# Patient Record
Sex: Male | Born: 1995 | Race: White | Hispanic: No | Marital: Married | Smoking: Current every day smoker
Health system: Southern US, Community
[De-identification: ages and names within clinical notes are randomized; demographics above are authoritative.]

## PROBLEM LIST (undated history)

## (undated) DIAGNOSIS — R519 Headache, unspecified: Secondary | ICD-10-CM

## (undated) DIAGNOSIS — S299XXA Unspecified injury of thorax, initial encounter: Secondary | ICD-10-CM

## (undated) DIAGNOSIS — J45909 Unspecified asthma, uncomplicated: Secondary | ICD-10-CM

## (undated) DIAGNOSIS — T148XXA Other injury of unspecified body region, initial encounter: Secondary | ICD-10-CM

## (undated) DIAGNOSIS — R51 Headache: Secondary | ICD-10-CM

## (undated) DIAGNOSIS — Z87442 Personal history of urinary calculi: Secondary | ICD-10-CM

## (undated) HISTORY — PX: TYMPANOSTOMY TUBE PLACEMENT: SHX32

---
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
## 2016-10-10 DIAGNOSIS — Z87442 Personal history of urinary calculi: Secondary | ICD-10-CM

## 2016-10-10 HISTORY — DX: Personal history of urinary calculi: Z87.442

## 2016-10-12 ENCOUNTER — Other Ambulatory Visit: Payer: Self-pay | Admitting: Family Medicine

## 2016-10-12 DIAGNOSIS — R310 Gross hematuria: Secondary | ICD-10-CM

## 2016-10-17 ENCOUNTER — Ambulatory Visit
Admission: RE | Admit: 2016-10-17 | Discharge: 2016-10-17 | Disposition: A | Discharge status: Home / Self Care | Payer: 59 | Source: Ambulatory Visit | Attending: Family Medicine | Admitting: Family Medicine

## 2016-10-17 DIAGNOSIS — R310 Gross hematuria: Secondary | ICD-10-CM | POA: Insufficient documentation

## 2016-10-17 DIAGNOSIS — N200 Calculus of kidney: Secondary | ICD-10-CM | POA: Diagnosis not present

## 2016-10-31 ENCOUNTER — Encounter: Payer: Self-pay | Admitting: Urology

## 2016-10-31 ENCOUNTER — Ambulatory Visit (INDEPENDENT_AMBULATORY_CARE_PROVIDER_SITE_OTHER): Payer: 59 | Admitting: Urology

## 2016-10-31 ENCOUNTER — Ambulatory Visit
Admission: RE | Admit: 2016-10-31 | Discharge: 2016-10-31 | Disposition: A | Discharge status: Home / Self Care | Payer: 59 | Source: Ambulatory Visit | Attending: Urology | Admitting: Urology

## 2016-10-31 VITALS — BP 107/64 | HR 71 | Ht 65.0 in | Wt 138.8 lb

## 2016-10-31 DIAGNOSIS — N200 Calculus of kidney: Secondary | ICD-10-CM | POA: Diagnosis not present

## 2016-10-31 DIAGNOSIS — R310 Gross hematuria: Secondary | ICD-10-CM

## 2016-10-31 LAB — URINALYSIS, COMPLETE
Bilirubin, UA: NEGATIVE
Glucose, UA: NEGATIVE
Ketones, UA: NEGATIVE
Leukocytes, UA: NEGATIVE
NITRITE UA: NEGATIVE
Protein, UA: NEGATIVE
RBC, UA: NEGATIVE
Specific Gravity, UA: 1.015 (ref 1.005–1.030)
UUROB: 0.2 mg/dL (ref 0.2–1.0)
pH, UA: 7.5 (ref 5.0–7.5)

## 2016-10-31 MED ORDER — IOPAMIDOL (ISOVUE-300) INJECTION 61%: 125.0000 mL | Freq: Once | INTRAVENOUS | Status: DC | PRN

## 2016-10-31 MED ORDER — IOPAMIDOL (ISOVUE-300) INJECTION 61%
150.0000 mL | Freq: Once | INTRAVENOUS | Status: AC | PRN
  Administered 2016-10-31: 150 mL via INTRAVENOUS

## 2016-10-31 NOTE — Progress Notes (Signed)
10/31/2016 1:02 PM   Wayne Cortez March 01, 1995 [id number]  Referring provider: Marina GoodellFeldpausch, Dale E, MD [address] [place], [place] [id number]  Chief Complaint  Patient presents with  . Hematuria    HPI: Wayne Cortez is a 21 year old male seen in consultation at the request of Dr. Maryjane HurterFeldpausch for evaluation of gross hematuria and nephrolithiasis.  He states 3-4 weeks ago he had onset of total gross hematuria which was associated with mild dysuria and left pelvic/back pain.  The hematuria was consistent for 48 hours and has been intermittent since that time.  Last episode that he noticed blood was yesterday.  He has mild urinary frequency.  Denies prior history of hematuria or previous urologic problems.  A stone protocol CT was performed on 10/17/2016 which showed a left pelvic kidney and small, bilateral nonobstructing renal calculi.  He has no voiding complaints.   PMH: History reviewed. No pertinent past medical history.  Surgical History: History reviewed. No pertinent surgical history.  Home Medications:  Allergies as of 10/31/2016   Not on File     Medication List       Accurate as of 10/31/16  1:02 PM. Always use your most recent med list.          cyclobenzaprine 5 MG tablet Commonly known as:  FLEXERIL Take 1 tablet by mouth 3 (three) times daily as needed.   meloxicam 15 MG tablet Commonly known as:  MOBIC Take 1 tablet by mouth daily.       Allergies: Allergies not on file  Family History: History reviewed. No pertinent family history.  Social History:  reports that he has been smoking E-cigarettes.  He uses smokeless tobacco. He reports that he drinks alcohol. He reports that he does not use drugs.  ROS: UROLOGY Frequent Urination?: Yes Hard to postpone urination?: No Burning/pain with urination?: No Get up at night to urinate?: No Leakage of urine?: No Urine stream starts and stops?: No Trouble starting stream?: No Do you have to strain to  urinate?: No Blood in urine?: Yes Urinary tract infection?: No Sexually transmitted disease?: No Injury to kidneys or bladder?: No Painful intercourse?: No Weak stream?: No Erection problems?: No Penile pain?: No  Gastrointestinal Nausea?: Yes Vomiting?: Yes Indigestion/heartburn?: No Diarrhea?: No Constipation?: No  Constitutional Fever: No Night sweats?: No Weight loss?: No Fatigue?: Yes  Skin Skin rash/lesions?: No Itching?: No  Eyes Blurred vision?: No Double vision?: No  Ears/Nose/Throat Sore throat?: No Sinus problems?: No  Hematologic/Lymphatic Swollen glands?: No Easy bruising?: No  Cardiovascular Leg swelling?: No Chest pain?: No  Respiratory Cough?: No Shortness of breath?: No  Endocrine Excessive thirst?: No  Musculoskeletal Back pain?: Yes Joint pain?: No  Neurological Headaches?: No Dizziness?: No  Psychologic Depression?: No Anxiety?: No  Physical Exam: BP 107/64 (BP Location: Right Arm, Patient Position: Sitting, Cuff Size: Normal)   Pulse 71   Ht 5\' 5"  (1.651 m)   Wt 138 lb 12.8 oz (63 kg)   BMI 23.10 kg/m   Constitutional:  Alert and oriented, No acute distress. HEENT: [place] AT, moist mucus membranes.  Trachea midline, no masses. Cardiovascular: No clubbing, cyanosis, or edema. Respiratory: Normal respiratory effort, no increased work of breathing. GI: Abdomen is soft, nontender, nondistended, no abdominal masses GU: No CVA tenderness.  Penis circumcised without lesions, testes descended bilaterally without masses or tenderness, cord/epididymes is palpably normal, no paratesticular abnormalities. Skin: No rashes, bruises or suspicious lesions. Lymph: No cervical or inguinal adenopathy. Neurologic: Grossly intact,  no focal deficits, moving all 4 extremities. Psychiatric: Normal mood and affect.  Laboratory Data: Lab Results  Component Value Date   WBC 9.6 10/19/2012   HGB 16.0 10/19/2012   HCT 47.3 10/19/2012   MCV 90  10/19/2012   PLT 176 10/19/2012    Urinalysis Urinalysis today negative for blood and leukocytes   Pertinent Imaging: CT of 10/17/2016 personally reviewed  Results for orders placed during the hospital encounter of 10/17/16  CT RENAL STONE STUDY   Narrative CLINICAL DATA:  Bilateral lower abdominal and back pain for 2 weeks with gross hematuria.  EXAM: CT ABDOMEN AND PELVIS WITHOUT CONTRAST  TECHNIQUE: Multidetector CT imaging of the abdomen and pelvis was performed following the standard protocol without IV contrast.  COMPARISON:  None.  FINDINGS: Lower chest:  Unremarkable  Hepatobiliary: No focal abnormality in the liver on this study without intravenous contrast. There is no evidence for gallstones, gallbladder wall thickening, or pericholecystic fluid. No intrahepatic or extrahepatic biliary dilation.  Pancreas: No focal mass lesion. No dilatation of the main duct. No intraparenchymal cyst. No peripancreatic edema.  Spleen: No splenomegaly. No focal mass lesion.  Adrenals/Urinary Tract: No adrenal nodule or mass. - several tiny nonobstructing stones are identified in the right kidney measuring up to about 4 mm in the lower pole. 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 No ureteral stones. No bladder stones. No hydroureteronephrosis.  Stomach/Bowel: Stomach is nondistended. No gastric wall thickening. No evidence of outlet obstruction. Duodenum is normally positioned as is the ligament of Treitz. No small bowel wall thickening. No small bowel dilatation. The terminal ileum is normal. The appendix is not visualized, but there is no edema or inflammation in the region of the cecum. No gross colonic mass. No colonic wall thickening. No substantial diverticular change.  Vascular/Lymphatic: No abdominal aortic aneurysm. No abdominal aortic atherosclerotic calcification. There is no gastrohepatic or hepatoduodenal  ligament lymphadenopathy. No intraperitoneal or retroperitoneal lymphadenopathy. No pelvic sidewall lymphadenopathy.  Reproductive: The prostate gland and seminal vesicles have normal imaging features.  Other: No intraperitoneal free fluid.  Musculoskeletal: Bone windows reveal no worrisome lytic or sclerotic osseous lesions.  IMPRESSION: 1. Tiny bilateral nonobstructing renal stones. 2. Left pelvic kidney.   Electronically Signed   By: Kennith Center M.D.   On: 10/17/2016 15:55     Assessment & Plan:    1. Gross hematuria He and his mother were informed that the small, nonobstructing renal calculi would be an unlikely source of his gross hematuria.  Have recommended proceeding with a CT urogram.  If no definite abnormalities on CT urogram will proceed with cystoscopy under anesthesia  - Urinalysis, Complete - CT HEMATURIA WORKUP; Future    Riki Altes, MD  [facility] [address], [address] [place], [place] [address] [phone]

## 2016-11-02 ENCOUNTER — Ambulatory Visit: Payer: 59 | Admitting: Urology

## 2016-11-02 ENCOUNTER — Ambulatory Visit (INDEPENDENT_AMBULATORY_CARE_PROVIDER_SITE_OTHER): Payer: 59 | Admitting: Urology

## 2016-11-02 VITALS — BP 122/68 | HR 62 | Ht 65.0 in | Wt 132.5 lb

## 2016-11-02 DIAGNOSIS — R310 Gross hematuria: Secondary | ICD-10-CM

## 2016-11-02 NOTE — Progress Notes (Signed)
11/02/2016 4:39 PM   Wayne Cortez 03-02-1995 [id number]  Referring provider: Marina Goodell, MD [address] [place], [place] [address]  Chief Complaint  Patient presents with  . Follow-up    HPI: Seen on 10/31/2016 for gross hematuria.  A noncontrast CT of the abdomen pelvis remarkable for punctate, nonobstructing renal calculi bilaterally.  A CT urogram was recommended which was performed yesterday.  He has a left pelvic kidney.  Other than the punctate bilateral renal calculi no other abnormalities are noted.  PMH: No past medical history on file.  Surgical History: No past surgical history on file.  Home Medications:  Allergies as of 11/02/2016   Not on File     Medication List       Accurate as of 11/02/16  4:39 PM. Always use your most recent med list.          cyclobenzaprine 5 MG tablet Commonly known as:  FLEXERIL Take 1 tablet by mouth 3 (three) times daily as needed.   meloxicam 15 MG tablet Commonly known as:  MOBIC Take 1 tablet by mouth daily.       Allergies: Allergies not on file  Family History: No family history on file.  Social History:  reports that he has been smoking E-cigarettes.  He uses smokeless tobacco. He reports that he drinks alcohol. He reports that he does not use drugs.  ROS: UROLOGY Frequent Urination?: No Hard to postpone urination?: No Burning/pain with urination?: No Get up at night to urinate?: No Leakage of urine?: No Urine stream starts and stops?: No Trouble starting stream?: No Do you have to strain to urinate?: No Blood in urine?: No Urinary tract infection?: No Sexually transmitted disease?: No Injury to kidneys or bladder?: No Painful intercourse?: No Weak stream?: No Erection problems?: No Penile pain?: No  Gastrointestinal Nausea?: No Vomiting?: No Indigestion/heartburn?: No Diarrhea?: No Constipation?: No  Constitutional Fever: No Night sweats?: No Weight loss?: No Fatigue?:  No  Skin Skin rash/lesions?: No Itching?: No  Eyes Blurred vision?: No Double vision?: No  Ears/Nose/Throat Sore throat?: No Sinus problems?: No  Hematologic/Lymphatic Swollen glands?: No Easy bruising?: No  Cardiovascular Leg swelling?: No Chest pain?: No  Respiratory Cough?: No Shortness of breath?: No  Endocrine Excessive thirst?: No  Musculoskeletal Back pain?: Yes Joint pain?: No  Neurological Headaches?: Yes Dizziness?: No  Psychologic Depression?: No Anxiety?: No  Physical Exam: BP 122/68   Pulse 62   Ht 5\' 5"  (1.651 m)   Wt 132 lb 8 oz (60.1 kg)   BMI 22.05 kg/m   Constitutional:  Alert and oriented, No acute distress. HEENT: [place] AT, moist mucus membranes.  Trachea midline, no masses. Cardiovascular: No clubbing, cyanosis, or edema. RRR Respiratory: Normal respiratory effort, no increased work of breathing.  Clear to auscultation GI: Abdomen is soft, nontender, nondistended, no abdominal masses GU: No CVA tenderness.  Skin: No rashes, bruises or suspicious lesions. Lymph: No cervical or inguinal adenopathy. Neurologic: Grossly intact, no focal deficits, moving all 4 extremities. Psychiatric: Normal mood and affect.  Laboratory Data: Lab Results  Component Value Date   WBC 9.6 10/19/2012   HGB 16.0 10/19/2012   HCT 47.3 10/19/2012   MCV 90 10/19/2012   PLT 176 10/19/2012   Urinalysis Lab Results  Component Value Date   SPECGRAV 1.015 10/31/2016   PHUR 7.5 10/31/2016   COLORU Yellow 10/31/2016   APPEARANCEUR Clear 10/31/2016   LEUKOCYTESUR Negative 10/31/2016   PROTEINUR Negative 10/31/2016  GLUCOSEU Negative 10/31/2016   KETONESU Negative 10/31/2016   RBCU Negative 10/31/2016   BILIRUBINUR Negative 10/31/2016   UUROB 0.2 10/31/2016   NITRITE Negative 10/31/2016    No results found for: LABMICR, WBCUA, RBCUA, LABEPIT, MUCUS, BACTERIA  Pertinent Imaging:  CT urogram personally reviewed  Results for orders placed during the  hospital encounter of 10/31/16  CT HEMATURIA WORKUP   Narrative CLINICAL DATA:  21 year old male with history of kidney stones. Recent history of hematuria.  EXAM: CT ABDOMEN AND PELVIS WITHOUT AND WITH CONTRAST  TECHNIQUE: Multidetector CT imaging of the abdomen and pelvis was performed following the standard protocol before and following the bolus administration of intravenous contrast.  CONTRAST:  150mL ISOVUE-300 IOPAMIDOL (ISOVUE-300) INJECTION 61%  COMPARISON:  CT the abdomen and pelvis 10/17/2016.  FINDINGS: Lower chest: Unremarkable.  Hepatobiliary: No suspicious cystic or solid hepatic lesions. No intra or extrahepatic biliary ductal dilatation. Gallbladder is normal in appearance.  Pancreas: No pancreatic mass. No pancreatic ductal dilatation. No pancreatic or peripancreatic fluid or inflammatory changes.  Spleen: Unremarkable.  Adrenals/Urinary Tract: Noncontrast images demonstrate some very faint 1-2 mm calcifications in the right renal collecting system, which likely represent tiny developing nonobstructive calculi. Possible 1 mm nonobstructive calculus in the left renal collecting system as well. No definite calculi are noted along the course of either ureter or within the lumen of the urinary bladder. There is no hydroureteronephrosis to suggest urinary tract obstruction at this time. Right kidney is normal in appearance. A left-sided pelvic kidney. Delayed post-contrast images demonstrate no definite filling defect within the collecting system of either kidney, along the course of either ureter, or within the lumen of the urinary bladder to strongly suggest the presence of a urothelial neoplasm at this time. Bilateral adrenal glands are normal in appearance.  Stomach/Bowel: Normal appearance of the stomach. No pathologic dilatation of small bowel or colon. The appendix is not confidently identified and may be surgically absent. Regardless, there are  no inflammatory changes noted adjacent to the cecum to suggest the presence of an acute appendicitis at this time.  Vascular/Lymphatic: No significant atherosclerotic disease or aneurysm identified in the visualized abdominal vasculature. No lymphadenopathy noted in the abdomen or pelvis.  Reproductive: Prostate gland and seminal vesicles are unremarkable in appearance.  Other: No significant volume of ascites.  No pneumoperitoneum.  Musculoskeletal: There are no aggressive appearing lytic or blastic lesions noted in the visualized portions of the skeleton.  IMPRESSION: 1. Tiny nonobstructive calculi in the collecting systems of both kidneys. No ureteral stones or findings of urinary tract obstruction are noted at this time. 2. 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   Electronically Signed   By: Trudie Reedaniel  Entrikin M.D.   On: 10/31/2016 15:13    Results for orders placed during the hospital encounter of 10/17/16  CT RENAL STONE STUDY   Narrative CLINICAL DATA:  Bilateral lower abdominal and back pain for 2 weeks with gross hematuria.  EXAM: CT ABDOMEN AND PELVIS WITHOUT CONTRAST  TECHNIQUE: Multidetector CT imaging of the abdomen and pelvis was performed following the standard protocol without IV contrast.  COMPARISON:  None.  FINDINGS: Lower chest:  Unremarkable  Hepatobiliary: No focal abnormality in the liver on this study without intravenous contrast. There is no evidence for gallstones, gallbladder wall thickening, or pericholecystic fluid. No intrahepatic or extrahepatic biliary dilation.  Pancreas: No focal mass lesion. No dilatation of the main duct. No intraparenchymal cyst. No peripancreatic edema.  Spleen: No splenomegaly. No focal  mass lesion.  Adrenals/Urinary Tract: No adrenal nodule or mass. - several tiny nonobstructing stones are identified in the right kidney measuring up to about 4 mm in the lower pole.  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 No ureteral stones. No bladder stones. No hydroureteronephrosis.  Stomach/Bowel: Stomach is nondistended. No gastric wall thickening. No evidence of outlet obstruction. Duodenum is normally positioned as is the ligament of Treitz. No small bowel wall thickening. No small bowel dilatation. The terminal ileum is normal. The appendix is not visualized, but there is no edema or inflammation in the region of the cecum. No gross colonic mass. No colonic wall thickening. No substantial diverticular change.  Vascular/Lymphatic: No abdominal aortic aneurysm. No abdominal aortic atherosclerotic calcification. There is no gastrohepatic or hepatoduodenal ligament lymphadenopathy. No intraperitoneal or retroperitoneal lymphadenopathy. No pelvic sidewall lymphadenopathy.  Reproductive: The prostate gland and seminal vesicles have normal imaging features.  Other: No intraperitoneal free fluid.  Musculoskeletal: Bone windows reveal no worrisome lytic or sclerotic osseous lesions.  IMPRESSION: 1. Tiny bilateral nonobstructing renal stones. 2. Left pelvic kidney.   Electronically Signed   By: Kennith Center M.D.   On: 10/17/2016 15:55     Assessment & Plan:   1.  Gross hematuria Discussed with patient and his mother that the small renal calculi would be an unlikely cause of his gross hematuria.  Recommended cystoscopy for lower tract evaluation.  He requested this be performed under anesthesia/sedation.  The procedure was discussed in detail including potential risks of bleeding and infection.  They are agreeable to bladder biopsy if any abnormalities are noted intraoperatively.  - Urinalysis, Complete   Riki Altes, MD   [facility] [address], [address] [place], [place] [address] [phone]

## 2016-11-03 LAB — URINALYSIS, COMPLETE
Bilirubin, UA: NEGATIVE
Glucose, UA: NEGATIVE
Ketones, UA: NEGATIVE
LEUKOCYTES UA: NEGATIVE
NITRITE UA: NEGATIVE
PH UA: 7.5 (ref 5.0–7.5)
PROTEIN UA: NEGATIVE
RBC, UA: NEGATIVE
SPEC GRAV UA: 1.02 (ref 1.005–1.030)
Urobilinogen, Ur: 0.2 mg/dL (ref 0.2–1.0)

## 2016-11-04 ENCOUNTER — Telehealth: Payer: Self-pay

## 2016-11-04 ENCOUNTER — Ambulatory Visit: Payer: 59 | Admitting: Urology

## 2016-11-04 NOTE — Telephone Encounter (Signed)
Patient notified of surgery date and pre admit phone call date. The patient is scheduled for surgery with Dr Lonna CobbStoioff at [facility] on 11/15/16. He will pre admit by phone on 11/11/16. A surgery packet has been mailed to the patient. The patient is aware of dates and instructions.

## 2016-11-07 ENCOUNTER — Ambulatory Visit: Payer: Self-pay

## 2016-11-07 DIAGNOSIS — N200 Calculus of kidney: Secondary | ICD-10-CM | POA: Insufficient documentation

## 2016-11-07 DIAGNOSIS — R310 Gross hematuria: Secondary | ICD-10-CM | POA: Insufficient documentation

## 2016-11-09 ENCOUNTER — Other Ambulatory Visit: Payer: 59

## 2016-11-11 ENCOUNTER — Encounter
Admission: RE | Admit: 2016-11-11 | Discharge: 2016-11-11 | Disposition: A | Discharge status: Home / Self Care | Payer: 59 | Source: Ambulatory Visit | Attending: Urology | Admitting: Urology

## 2016-11-11 HISTORY — DX: Headache, unspecified: R51.9

## 2016-11-11 HISTORY — DX: Other injury of unspecified body region, initial encounter: T14.8XXA

## 2016-11-11 HISTORY — DX: Unspecified injury of thorax, initial encounter: S29.9XXA

## 2016-11-11 HISTORY — DX: Unspecified asthma, uncomplicated: J45.909

## 2016-11-11 HISTORY — DX: Headache: R51

## 2016-11-11 HISTORY — DX: Personal history of urinary calculi: Z87.442

## 2016-11-11 NOTE — Patient Instructions (Signed)
  Your procedure is scheduled on: 11-15-16 Report to Same Day Surgery 2nd floor medical mall [facility](Medical Mall Entrance-take elevator on left to 2nd floor.  Check in with surgery information desk.) To find out your arrival time please call [phone] between 1PM - 3PM on 11-14-16  Remember: Instructions that are not followed completely may result in serious medical risk, up to and including death, or upon the discretion of your surgeon and anesthesiologist your surgery may need to be rescheduled.    _x___ 1. Do not eat food after midnight the night before your procedure. You may drink clear liquids up to 2 hours before you are scheduled to arrive at the hospital for your procedure.  Do not drink clear liquids within 2 hours of your scheduled arrival to the hospital.  Clear liquids include  --Water or Apple juice without pulp  --Clear carbohydrate beverage such as ClearFast or Gatorade  --Black Coffee or Clear Tea (No milk, no creamers, do not add anything to the coffee or Tea Type 1 and type 2 diabetics should only drink water.  No gum chewing or hard candies.     __x__ 2. No Alcohol for 24 hours before or after surgery.   __x__3. No Smoking for 24 prior to surgery.   ____  4. Bring all medications with you on the day of surgery if instructed.    __x__ 5. Notify your doctor if there is any change in your medical condition     (cold, fever, infections).     Do not wear jewelry, make-up, hairpins, clips or nail polish.  Do not wear lotions, powders, or perfumes. You may wear deodorant.  Do not shave 48 hours prior to surgery. Men may shave face and neck.  Do not bring valuables to the hospital.    [facility] is not responsible for any belongings or valuables.               Contacts, dentures or bridgework may not be worn into surgery.  Leave your suitcase in the car. After surgery it may be brought to your room.  For patients admitted to the hospital, discharge time is determined by your  treatment team.   Patients discharged the day of surgery will not be allowed to drive home.  You will need someone to drive you home and stay with you the night of your procedure.    Please read over the following fact sheets that you were given:     ____ Take anti-hypertensive listed below, cardiac, seizure, asthma,anti-reflux and psychiatric medicines. These include:  1. NONE  2.  3.  4.  5.  6.  ____Fleets enema or Magnesium Citrate as directed.   ____ Use CHG Soap or sage wipes as directed on instruction sheet   ____ Use inhalers on the day of surgery and bring to hospital day of surgery  ____ Stop Metformin and Janumet 2 days prior to surgery.    ____ Take 1/2 of usual insulin dose the night before surgery and none on the morning surgery.   ____ Follow recommendations from Cardiologist, Pulmonologist or PCP regarding stopping Aspirin, Coumadin, Plavix ,Eliquis, Effient, or Pradaxa, and Pletal.  X____Stop Anti-inflammatories such as Advil, Aleve, Ibuprofen, Motrin, Naproxen,MELOXICAM, Naprosyn, Goodies powders or aspirin products NOW-OK to take Tylenol   ____ Stop supplements until after surgery.   ____ Bring C-Pap to the hospital.

## 2016-11-14 ENCOUNTER — Ambulatory Visit: Payer: 59 | Admitting: Urology

## 2016-11-14 MED ORDER — CEFAZOLIN SODIUM-DEXTROSE 2-4 GM/100ML-% IV SOLN
2.0000 g | Freq: Once | INTRAVENOUS | Status: AC
  Administered 2016-11-15: 2 g via INTRAVENOUS

## 2016-11-15 ENCOUNTER — Ambulatory Visit: Payer: 59 | Admitting: Registered Nurse

## 2016-11-15 ENCOUNTER — Ambulatory Visit
Admission: RE | Admit: 2016-11-15 | Discharge: 2016-11-15 | Disposition: A | Discharge status: Home / Self Care | Payer: 59 | Source: Ambulatory Visit | Attending: Urology | Admitting: Urology

## 2016-11-15 ENCOUNTER — Encounter
Admission: RE | Disposition: A | Discharge status: Home / Self Care | Payer: Self-pay | Source: Ambulatory Visit | Attending: Urology

## 2016-11-15 DIAGNOSIS — F17200 Nicotine dependence, unspecified, uncomplicated: Secondary | ICD-10-CM | POA: Insufficient documentation

## 2016-11-15 DIAGNOSIS — Z87442 Personal history of urinary calculi: Secondary | ICD-10-CM | POA: Insufficient documentation

## 2016-11-15 DIAGNOSIS — R310 Gross hematuria: Secondary | ICD-10-CM

## 2016-11-15 DIAGNOSIS — J45909 Unspecified asthma, uncomplicated: Secondary | ICD-10-CM | POA: Diagnosis not present

## 2016-11-15 HISTORY — PX: CYSTOSCOPY: SHX5120

## 2016-11-15 SURGERY — CYSTOSCOPY
Anesthesia: General | Site: Bladder | Wound class: Clean Contaminated

## 2016-11-15 MED ORDER — LIDOCAINE HCL (CARDIAC) 20 MG/ML IV SOLN
INTRAVENOUS | Status: DC | PRN
  Administered 2016-11-15: 60 mg via INTRAVENOUS

## 2016-11-15 MED ORDER — ONDANSETRON HCL 4 MG PO TABS: 4.0000 mg | ORAL_TABLET | Freq: Three times a day (TID) | ORAL | 0 refills | Status: AC | PRN

## 2016-11-15 MED ORDER — ONDANSETRON HCL 4 MG/2ML IJ SOLN: 4.0000 mg | Freq: Once | INTRAMUSCULAR | Status: DC | PRN

## 2016-11-15 MED ORDER — SEVOFLURANE IN SOLN
RESPIRATORY_TRACT | Status: AC
  Filled 2016-11-15: qty 250

## 2016-11-15 MED ORDER — MIDAZOLAM HCL 2 MG/2ML IJ SOLN
INTRAMUSCULAR | Status: DC | PRN
  Administered 2016-11-15: 2 mg via INTRAVENOUS

## 2016-11-15 MED ORDER — FENTANYL CITRATE (PF) 100 MCG/2ML IJ SOLN
INTRAMUSCULAR | Status: DC | PRN
  Administered 2016-11-15 (×2): 25 ug via INTRAVENOUS

## 2016-11-15 MED ORDER — MIDAZOLAM HCL 2 MG/2ML IJ SOLN
INTRAMUSCULAR | Status: AC
  Filled 2016-11-15: qty 2

## 2016-11-15 MED ORDER — DEXAMETHASONE SODIUM PHOSPHATE 10 MG/ML IJ SOLN
INTRAMUSCULAR | Status: AC
  Filled 2016-11-15: qty 1

## 2016-11-15 MED ORDER — FAMOTIDINE 20 MG PO TABS
ORAL_TABLET | ORAL | Status: AC
  Filled 2016-11-15: qty 1

## 2016-11-15 MED ORDER — CEFAZOLIN SODIUM-DEXTROSE 2-4 GM/100ML-% IV SOLN
INTRAVENOUS | Status: AC
  Filled 2016-11-15: qty 100

## 2016-11-15 MED ORDER — PROPOFOL 10 MG/ML IV BOLUS
INTRAVENOUS | Status: AC
  Filled 2016-11-15: qty 40

## 2016-11-15 MED ORDER — LACTATED RINGERS IV SOLN
INTRAVENOUS | Status: DC
  Administered 2016-11-15: 06:00:00 via INTRAVENOUS

## 2016-11-15 MED ORDER — FENTANYL CITRATE (PF) 100 MCG/2ML IJ SOLN: 25.0000 ug | INTRAMUSCULAR | Status: DC | PRN

## 2016-11-15 MED ORDER — DEXAMETHASONE SODIUM PHOSPHATE 10 MG/ML IJ SOLN
INTRAMUSCULAR | Status: DC | PRN
  Administered 2016-11-15: 5 mg via INTRAVENOUS

## 2016-11-15 MED ORDER — ONDANSETRON HCL 4 MG/2ML IJ SOLN
INTRAMUSCULAR | Status: DC | PRN
  Administered 2016-11-15: 4 mg via INTRAVENOUS

## 2016-11-15 MED ORDER — SUCCINYLCHOLINE CHLORIDE 20 MG/ML IJ SOLN
INTRAMUSCULAR | Status: AC
  Filled 2016-11-15: qty 1

## 2016-11-15 MED ORDER — EPHEDRINE SULFATE 50 MG/ML IJ SOLN
INTRAMUSCULAR | Status: AC
  Filled 2016-11-15: qty 1

## 2016-11-15 MED ORDER — FENTANYL CITRATE (PF) 100 MCG/2ML IJ SOLN
INTRAMUSCULAR | Status: AC
  Filled 2016-11-15: qty 2

## 2016-11-15 MED ORDER — FAMOTIDINE 20 MG PO TABS
20.0000 mg | ORAL_TABLET | Freq: Once | ORAL | Status: AC
  Administered 2016-11-15: 20 mg via ORAL

## 2016-11-15 MED ORDER — GLYCOPYRROLATE 0.2 MG/ML IJ SOLN
INTRAMUSCULAR | Status: AC
  Filled 2016-11-15: qty 1

## 2016-11-15 MED ORDER — LIDOCAINE HCL (PF) 2 % IJ SOLN
INTRAMUSCULAR | Status: AC
  Filled 2016-11-15: qty 10

## 2016-11-15 MED ORDER — PHENYLEPHRINE HCL 10 MG/ML IJ SOLN
INTRAMUSCULAR | Status: AC
  Filled 2016-11-15: qty 1

## 2016-11-15 MED ORDER — GLYCOPYRROLATE 0.2 MG/ML IJ SOLN
INTRAMUSCULAR | Status: DC | PRN
  Administered 2016-11-15: 0.2 mg via INTRAVENOUS

## 2016-11-15 MED ORDER — ONDANSETRON HCL 4 MG/2ML IJ SOLN
INTRAMUSCULAR | Status: AC
  Filled 2016-11-15: qty 2

## 2016-11-15 MED ORDER — PROPOFOL 10 MG/ML IV BOLUS
INTRAVENOUS | Status: DC | PRN
  Administered 2016-11-15: 170 mg via INTRAVENOUS
  Administered 2016-11-15: 30 mg via INTRAVENOUS

## 2016-11-15 MED ORDER — PHENAZOPYRIDINE HCL 200 MG PO TABS
200.0000 mg | ORAL_TABLET | Freq: Three times a day (TID) | ORAL | 0 refills | Status: AC | PRN
Start: 2016-11-15 — End: ?

## 2016-11-15 SURGICAL SUPPLY — 17 items
BAG DRAIN CYSTO-URO LG1000N (MISCELLANEOUS) ×4 IMPLANT
DRSG TELFA 4X3 1S NADH ST (GAUZE/BANDAGES/DRESSINGS) ×4 IMPLANT
ELECT REM PT RETURN 9FT ADLT (ELECTROSURGICAL) ×4
ELECTRODE REM PT RTRN 9FT ADLT (ELECTROSURGICAL) ×2 IMPLANT
GLOVE BIO SURGEON STRL SZ 6.5 (GLOVE) ×3 IMPLANT
GLOVE BIO SURGEONS STRL SZ 6.5 (GLOVE) ×1
GOWN STRL REUS W/ TWL LRG LVL3 (GOWN DISPOSABLE) ×4 IMPLANT
GOWN STRL REUS W/TWL LRG LVL3 (GOWN DISPOSABLE) ×4
KIT RM TURNOVER CYSTO AR (KITS) ×4 IMPLANT
NDL SAFETY ECLIPSE 18X1.5 (NEEDLE) ×2 IMPLANT
NEEDLE HYPO 18GX1.5 SHARP (NEEDLE) ×2
PACK CYSTO AR (MISCELLANEOUS) ×4 IMPLANT
SCRUB POVIDONE IODINE 4 OZ (MISCELLANEOUS) ×4 IMPLANT
SET CYSTO W/LG BORE CLAMP LF (SET/KITS/TRAYS/PACK) ×4 IMPLANT
SURGILUBE 2OZ TUBE FLIPTOP (MISCELLANEOUS) ×4 IMPLANT
WATER STERILE IRR 1000ML POUR (IV SOLUTION) ×4 IMPLANT
WATER STERILE IRR 3000ML UROMA (IV SOLUTION) ×4 IMPLANT

## 2016-11-15 NOTE — Anesthesia Postprocedure Evaluation (Signed)
Anesthesia Post Note  Patient: Wayne MooreBrent D Wrede  Procedure(s) Performed: CYSTOSCOPY (N/A Bladder)  Patient location during evaluation: PACU Anesthesia Type: General Level of consciousness: awake and alert and oriented Pain management: pain level controlled Vital Signs Assessment: post-procedure vital signs reviewed and stable Respiratory status: spontaneous breathing Cardiovascular status: blood pressure returned to baseline Anesthetic complications: no     Last Vitals:  Vitals:   11/15/16 0859 11/15/16 0910  BP:  108/63  Pulse: (!) 54 (!) 52  Resp: 13 16  Temp: 36.7 C 36.6 C  SpO2: 100% 100%    Last Pain:  Vitals:   11/15/16 0910  TempSrc: Oral  PainSc: 2                  Turner Kunzman

## 2016-11-15 NOTE — Anesthesia Post-op Follow-up Note (Signed)
Anesthesia QCDR form completed.        

## 2016-11-15 NOTE — Anesthesia Preprocedure Evaluation (Signed)
Anesthesia Evaluation  Patient identified by MRN, date of birth, ID band Patient awake    Reviewed: Allergy & Precautions, NPO status , Patient's Chart, lab work & pertinent test results  Airway Mallampati: II  TM Distance: >3 FB     Dental  (+) Teeth Intact   Pulmonary asthma , Current Smoker,    Pulmonary exam normal        Cardiovascular negative cardio ROS Normal cardiovascular exam     Neuro/Psych  Headaches, negative psych ROS   GI/Hepatic negative GI ROS, Neg liver ROS,   Endo/Other  negative endocrine ROS  Renal/GU stones  negative genitourinary   Musculoskeletal negative musculoskeletal ROS (+)   Abdominal Normal abdominal exam  (+)   Peds negative pediatric ROS (+)  Hematology negative hematology ROS (+)   Anesthesia Other Findings   Reproductive/Obstetrics                             Anesthesia Physical Anesthesia Plan  ASA: II  Anesthesia Plan: General   Post-op Pain Management:    Induction: Intravenous  PONV Risk Score and Plan: 1  Airway Management Planned: LMA  Additional Equipment:   Intra-op Plan:   Post-operative Plan: Extubation in OR  Informed Consent: I have reviewed the patients History and Physical, chart, labs and discussed the procedure including the risks, benefits and alternatives for the proposed anesthesia with the patient or authorized representative who has indicated his/her understanding and acceptance.   Dental advisory given  Plan Discussed with: CRNA and Surgeon  Anesthesia Plan Comments:         Anesthesia Quick Evaluation

## 2016-11-15 NOTE — Anesthesia Procedure Notes (Signed)
Procedure Name: LMA Insertion Date/Time: 11/15/2016 7:48 AM Performed by: Stormy Fabianurtis, Travious Vanover, CRNA Pre-anesthesia Checklist: Patient identified, Patient being monitored, Timeout performed, Emergency Drugs available and Suction available Patient Re-evaluated:Patient Re-evaluated prior to induction Oxygen Delivery Method: Circle system utilized Preoxygenation: Pre-oxygenation with 100% oxygen Induction Type: IV induction Ventilation: Mask ventilation without difficulty LMA: LMA inserted LMA Size: 3.5 Tube type: Oral Number of attempts: 1 Placement Confirmation: positive ETCO2 and breath sounds checked- equal and bilateral Tube secured with: Tape Dental Injury: Teeth and Oropharynx as per pre-operative assessment

## 2016-11-15 NOTE — Transfer of Care (Signed)
Immediate Anesthesia Transfer of Care Note  Patient: Wayne MooreBrent D Arndt  Procedure(s) Performed: Procedure(s): CYSTOSCOPY (N/A)  Patient Location: PACU  Anesthesia Type:General  Level of Consciousness: sedated  Airway & Oxygen Therapy: Patient Spontanous Breathing and Patient connected to face mask oxygen  Post-op Assessment: Report given to RN and Post -op Vital signs reviewed and stable  Post vital signs: Reviewed and stable  Last Vitals:  Vitals:   11/15/16 0612 11/15/16 0814  BP: 124/69 (!) 89/35  Pulse: 60 (!) 58  Resp: 16 16  Temp: 36.9 C 36.6 C  SpO2: 99% 100%    Complications: No apparent anesthesia complications

## 2016-11-15 NOTE — Discharge Instructions (Signed)
AMBULATORY SURGERY  DISCHARGE INSTRUCTIONS   1) The drugs that you were given will stay in your system until tomorrow so for the next 24 hours you should not:  A) Drive an automobile B) Make any legal decisions C) Drink any alcoholic beverage   2) You may resume regular meals tomorrow.  Today it is better to start with liquids and gradually work up to solid foods.  You may eat anything you prefer, but it is better to start with liquids, then soup and crackers, and gradually work up to solid foods.   3) Please notify your doctor immediately if you have any unusual bleeding, trouble breathing, redness and pain at the surgery site, drainage, fever, or pain not relieved by medication.    4) Additional Instructions:  Follow up visit to be in 1 month from surgical date with Dr. Lonna CobbStoioff        Please contact your physician with any problems or Same Day Surgery at [phone], Monday through Friday 6 am to 4 pm, or [place] [facility] Main number at [phone].

## 2016-11-15 NOTE — Op Note (Signed)
Date of procedure: 11/15/2016  Preoperative diagnosis:  1. Gross hematuria  Postoperative diagnosis:  1. Gross hematuria  Procedure: 1. Cystoscopy  Surgeon: Irineo AxonScott Annaleah Arata, MD  Anesthesia: General  Complications: None  Intraoperative findings: No significant abnormalities  EBL: None  Specimens: None  Drains: None  Indication: Wayne GuilesBrent Pesnell is a 21 year old male with a history of intermittent gross hematuria.  CT urogram remarkable for punctate, bilateral nonobstructing renal calculi and a left pelvic kidney.  His hematuria has resolved.  He requested cystoscopy under anesthesia/sedation.  After reviewing the management options for treatment, he elected to proceed with the above surgical procedure(s). We have discussed the potential benefits and risks of the procedure, side effects of the proposed treatment, the likelihood of the patient achieving the goals of the procedure, and any potential problems that might occur during the procedure or recuperation. Informed consent has been obtained.  Description of procedure:  The patient was taken to the operating room and general anesthesia was induced.  The patient was placed in the dorsal lithotomy position, prepped and draped in the usual sterile fashion, and preoperative antibiotics were administered. A preoperative time-out was performed.   A 22 French cystoscope was located and passed under direct vision.  The urethra was normal caliber without stricture.  The prostate was nonocclusive and showed no abnormalities.  The bladder mucosa was closely inspected and showed no mucosal erythema, solid or papillary lesions.  The ureteral orifices were normal appearing bilaterally with clear efflux.  Vitals indeed and cystoscope was removed.  The patient was transported to the PACU in stable condition.  There were no complications.

## 2016-11-15 NOTE — H&P (Signed)
History and Physical Exam reviewed; patient is OK for planned anesthetic and procedure.  

## 2018-02-06 IMAGING — CT CT RENAL STONE PROTOCOL
2 of 4 series · 16 of 46 positions shown, 18 images · non-contrast
Comparison: None.

CLINICAL DATA: Bilateral lower abdominal and back pain for 2 weeks
with gross hematuria.

EXAM:
CT ABDOMEN AND PELVIS WITHOUT CONTRAST
TECHNIQUE: Multidetector CT imaging of the abdomen and pelvis was performed
following the standard protocol without IV contrast.

[Series 2: soft tissue · axial · 0.65mm/px · z∈[-856,-451]mm · 13 of 89 slices shown, 15 images]
[im 4/89  soft-tissue]
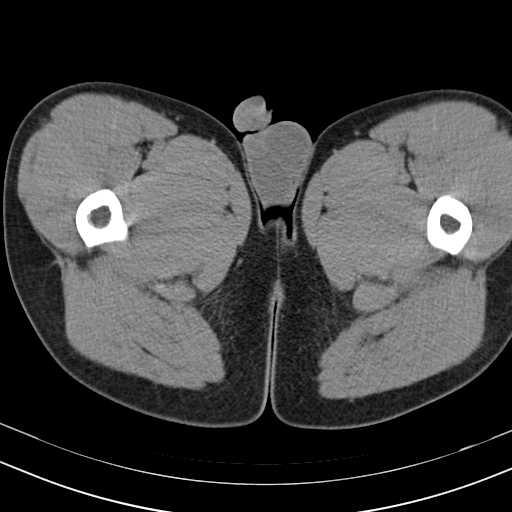
[im 4/89  bone]
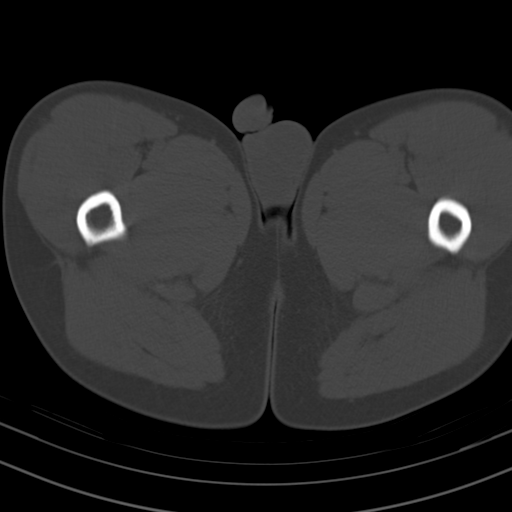
[im 12/89  soft-tissue]
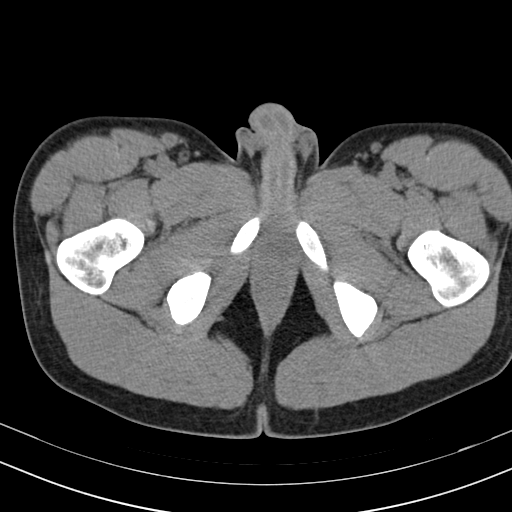
[im 19/89  soft-tissue]
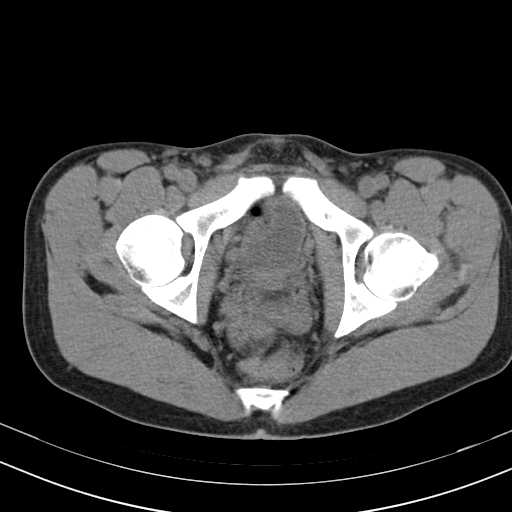
[im 26/89  soft-tissue]
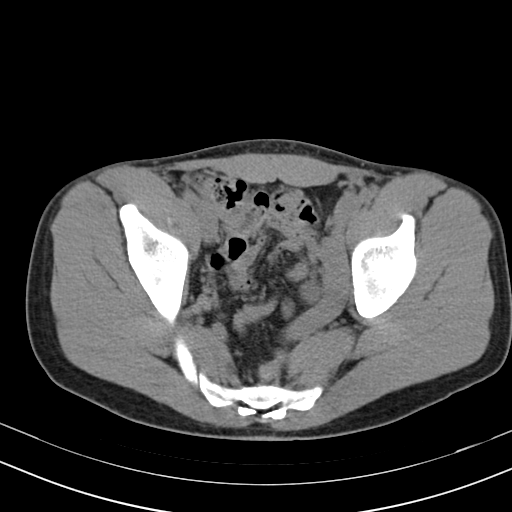
[im 30/89  soft-tissue]
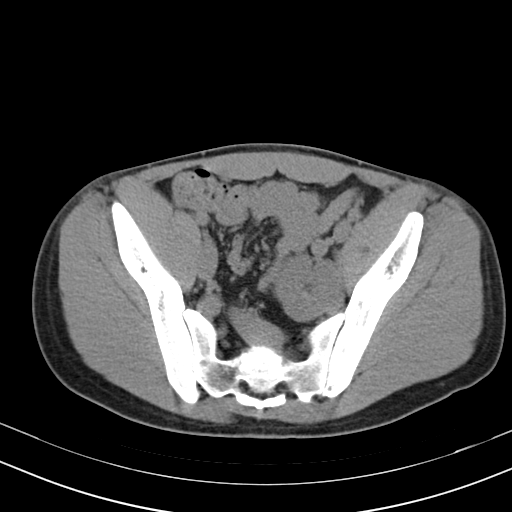
[im 37/89  soft-tissue]
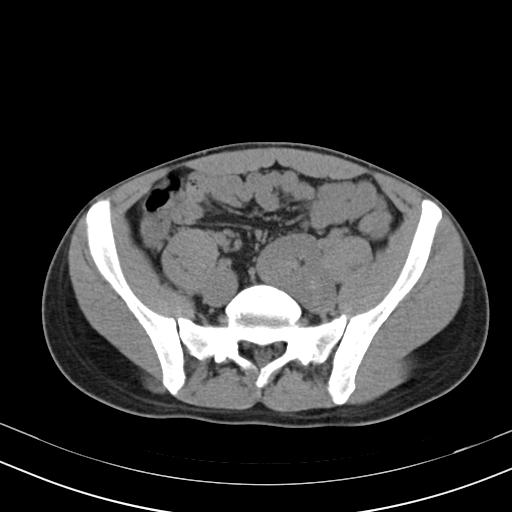
[im 45/89  soft-tissue]
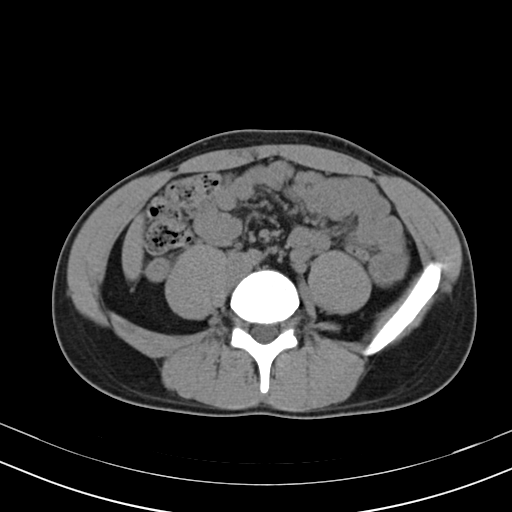
[im 52/89  soft-tissue]
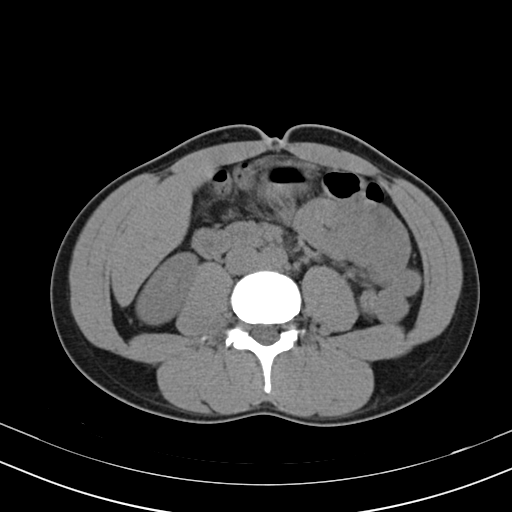
[im 59/89  soft-tissue]
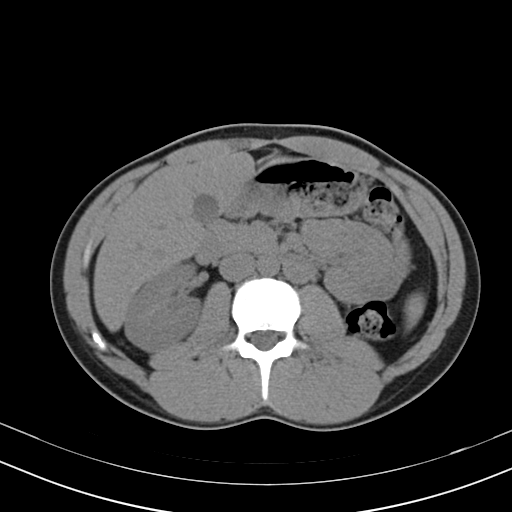
[im 59/89  bone]
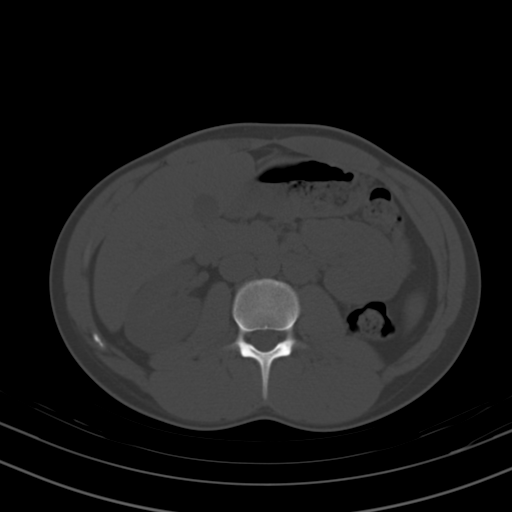
[im 63/89  soft-tissue]
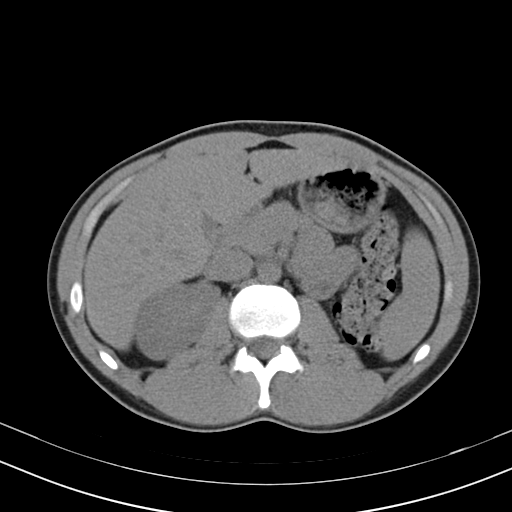
[im 70/89  soft-tissue]
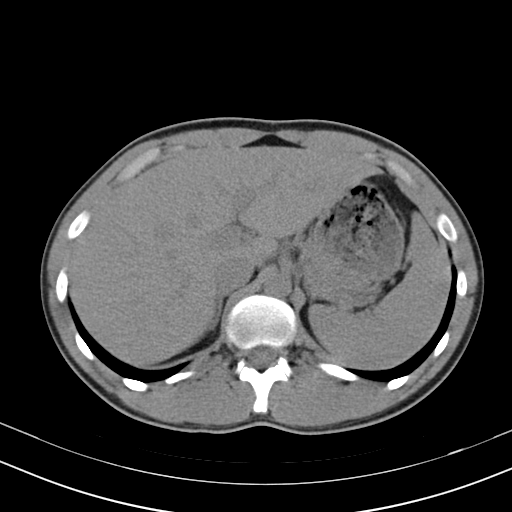
[im 78/89  soft-tissue]
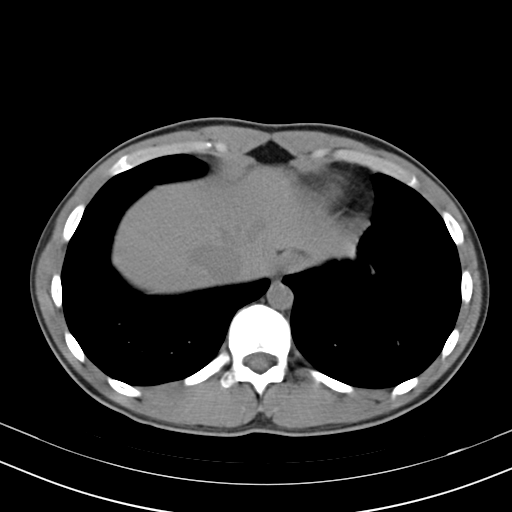
[im 85/89  soft-tissue]
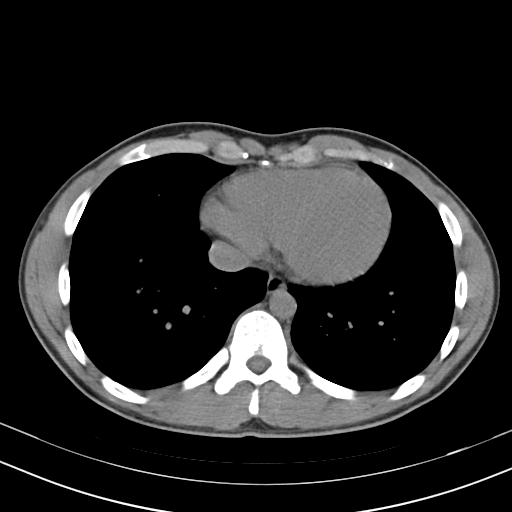

[Series 602: coronal · coronal · 0.86mm/px · 3 of 85 slices shown]
[im 29/85  soft-tissue]
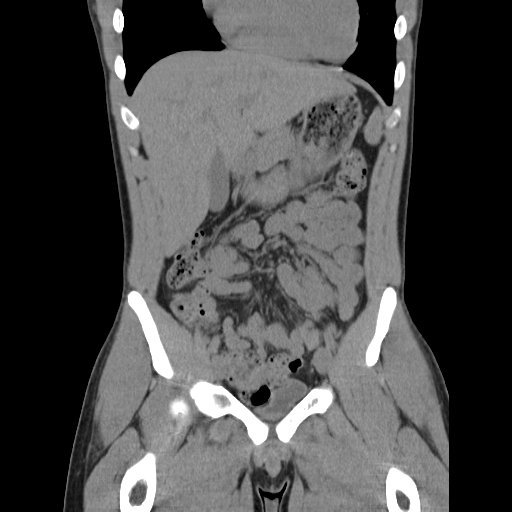
[im 38/85  soft-tissue]
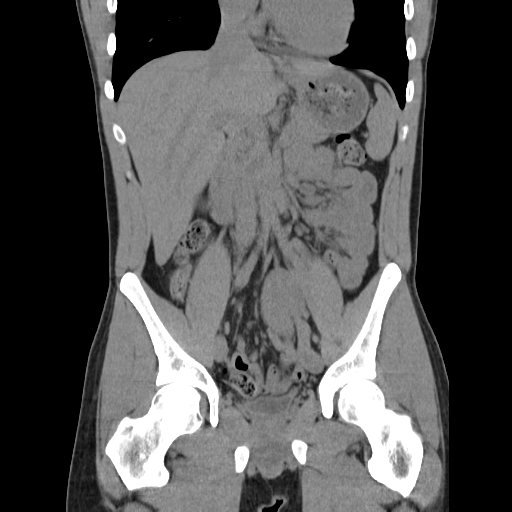
[im 47/85  soft-tissue]
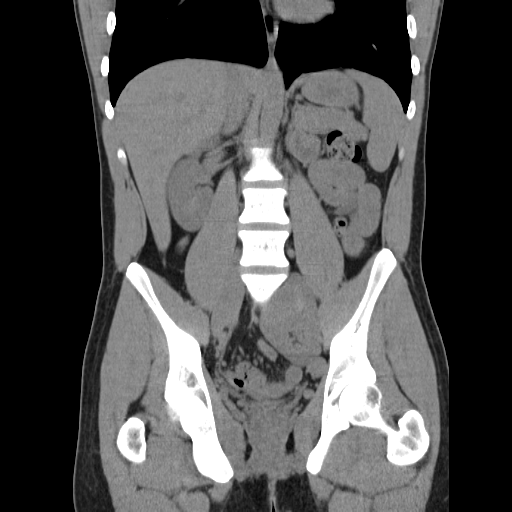

[16 of 46 positions shown; findings below may reference images not displayed]

FINDINGS: Lower chest:  Unremarkable

Hepatobiliary: No focal abnormality in the liver on this study
without intravenous contrast. There is no evidence for gallstones,
gallbladder wall thickening, or pericholecystic fluid. No
intrahepatic or extrahepatic biliary dilation.

Pancreas: No focal mass lesion. No dilatation of the main duct. No
intraparenchymal cyst. No peripancreatic edema.

Spleen: No splenomegaly. No focal mass lesion.

Adrenals/Urinary Tract: No adrenal nodule or mass. - several tiny
nonobstructing stones are identified in the right kidney measuring
up to about 4 mm in the lower pole. Patient is noted to have a left
pelvic kidney with possible 1-2 mm nonobstructing stone. No ureteral
stones. No bladder stones. No hydroureteronephrosis.

Stomach/Bowel: Stomach is nondistended. No gastric wall thickening.
No evidence of outlet obstruction. Duodenum is normally positioned
as is the ligament of Treitz. No small bowel wall thickening. No
small bowel dilatation. The terminal ileum is normal. The appendix
is not visualized, but there is no edema or inflammation in the
region of the cecum. No gross colonic mass. No colonic wall
thickening. No substantial diverticular change.

Vascular/Lymphatic: No abdominal aortic aneurysm. No abdominal
aortic atherosclerotic calcification. There is no gastrohepatic or
hepatoduodenal ligament lymphadenopathy. No intraperitoneal or
retroperitoneal lymphadenopathy. No pelvic sidewall lymphadenopathy.

Reproductive: The prostate gland and seminal vesicles have normal
imaging features.

Other: No intraperitoneal free fluid.

Musculoskeletal: Bone windows reveal no worrisome lytic or sclerotic
osseous lesions.
IMPRESSION: 1. Tiny bilateral nonobstructing renal stones.
2. Left pelvic kidney.

## 2020-12-06 ENCOUNTER — Ambulatory Visit
Admission: EM | Admit: 2020-12-06 | Discharge: 2020-12-06 | Disposition: A | Discharge status: Home / Self Care | Payer: 59 | Attending: Physician Assistant | Admitting: Physician Assistant

## 2020-12-06 ENCOUNTER — Encounter: Payer: Self-pay | Admitting: Emergency Medicine

## 2020-12-06 ENCOUNTER — Other Ambulatory Visit: Payer: Self-pay

## 2020-12-06 DIAGNOSIS — R509 Fever, unspecified: Secondary | ICD-10-CM | POA: Insufficient documentation

## 2020-12-06 DIAGNOSIS — Z8616 Personal history of COVID-19: Secondary | ICD-10-CM | POA: Diagnosis present

## 2020-12-06 DIAGNOSIS — J101 Influenza due to other identified influenza virus with other respiratory manifestations: Secondary | ICD-10-CM | POA: Insufficient documentation

## 2020-12-06 DIAGNOSIS — R051 Acute cough: Secondary | ICD-10-CM | POA: Insufficient documentation

## 2020-12-06 LAB — RESP PANEL BY RT-PCR (FLU A&B, COVID) ARPGX2
Influenza A by PCR: POSITIVE — AB
Influenza B by PCR: NEGATIVE
SARS Coronavirus 2 by RT PCR: POSITIVE — AB

## 2020-12-06 MED ORDER — PSEUDOEPH-BROMPHEN-DM 30-2-10 MG/5ML PO SYRP: 10.0000 mL | ORAL_SOLUTION | Freq: Four times a day (QID) | ORAL | 0 refills | Status: AC | PRN

## 2020-12-06 MED ORDER — OSELTAMIVIR PHOSPHATE 75 MG PO CAPS: 75.0000 mg | ORAL_CAPSULE | Freq: Two times a day (BID) | ORAL | 0 refills | Status: AC

## 2020-12-06 MED ORDER — IBUPROFEN 600 MG PO TABS
600.0000 mg | ORAL_TABLET | Freq: Once | ORAL | Status: AC
  Administered 2020-12-06: 13:00:00 600 mg via ORAL

## 2020-12-06 NOTE — Discharge Instructions (Addendum)
-  You are positive for influenza A.  This is a cause for your symptoms your COVID test is also positive but as we discussed that can remain positive for a couple of months.  It does not mean that you still have COVID or that you are contagious. -I printed a prescription for Tamiflu and also for cough.  Contact the pharmacies to see if they carry it before you go.  Some of them have been running low. - Increase rest and fluids. - Need to stay out of work until you are fever free greater than 24 hours and feeling better. - Use the home nebulizer if you feel short of breath again but your oxygen is normal today and your chest is clear. - Go to emergency department for any severe acute worsening of your symptoms, especially any uncontrollable fever, weakness or trouble breathing.

## 2020-12-06 NOTE — ED Provider Notes (Signed)
[facility]    CSN: [id number] Arrival date & time: 12/06/20  1239      History   Chief Complaint Chief Complaint  Patient presents with   Cough   Shortness of Breath    HPI Wayne Cortez is a 25 y.o. male presenting for 2 to 3-week history of fatigue.  Patient reports that he was diagnosed with COVID-19 about 2 weeks ago.  Patient reports continued fatigue since then.  He says that over the past 24 hours he has developed a fever.  Yesterday he started to have a cough and body aches.  He also reports that he felt short of breath earlier and checked his oxygen which was 90% so he used a nebulizer which improved his symptoms.  He did have asthma as a child but says he has not had issues as an adult.  He also reports a bit of a sore throat.  No vomiting or diarrhea.  Has been taking over-the-counter cough medication and Tylenol.  No other complaints.  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 Authorizing Provider  brompheniramine-pseudoephedrine-DM 30-2-10 MG/5ML syrup Take 10 mLs by mouth 4 (four) times daily as needed for up to 7 days. 12/06/20 12/13/20 Yes Danton Clap, PA-C  oseltamivir (TAMIFLU)  75 MG capsule Take 1 capsule (75 mg total) by mouth every 12 (twelve) hours for 5 days. 12/06/20 12/11/20 Yes Danton Clap, PA-C  acetaminophen (TYLENOL) 325 MG tablet Take 650 mg by mouth every 6 (six) hours as needed.    [provider]  cyclobenzaprine (FLEXERIL) 5 MG tablet Take 1 tablet by mouth 3 (three) times daily as needed.    [provider]  meloxicam (MOBIC) 15 MG tablet Take 1 tablet by mouth daily.    [provider]  ondansetron (ZOFRAN) 4 MG tablet Take 1 tablet (4 mg total) every 8 (eight) hours as needed by mouth for nausea or vomiting. 11/15/16   Stoioff, Ronda Fairly, MD  phenazopyridine (PYRIDIUM) 200 MG tablet Take 1 tablet (200 mg total) 3 (three) times daily as needed by mouth for pain (burning with urination). 11/15/16   Abbie Sons, MD    Family History History reviewed. No pertinent family history.  Social History Social History   Tobacco Use   Smoking status: Every Day    Types: E-cigarettes   Smokeless tobacco: Current  Vaping Use   Vaping Use: Former  Substance Use Topics   Alcohol use: Yes    Comment: occasionally   Drug use: No     Allergies  Patient has no known allergies.   Review of Systems Review of Systems  Constitutional:  Positive for fatigue and fever.  HENT:  Positive for congestion, rhinorrhea and sore throat. Negative for ear pain, sinus pressure and sinus pain.   Respiratory:  Positive for cough and shortness of breath.   Cardiovascular:  Negative for chest pain.  Gastrointestinal:  Negative for abdominal pain, diarrhea, nausea and vomiting.  Musculoskeletal:  Positive for myalgias.  Neurological:  Negative for weakness, light-headedness and headaches.  Hematological:  Negative for adenopathy.    Physical Exam Triage Vital Signs ED Triage Vitals  Enc Vitals Group     BP 12/06/20 1300 119/75     Pulse Rate 12/06/20 1300 (!) 113     Resp 12/06/20 1300 16     Temp 12/06/20 1300 (!) 102.3 F (39.1  C)     Temp Source 12/06/20 1300 Oral     SpO2 12/06/20 1300 97 %     Weight 12/06/20 1257 140 lb (63.5 kg)     Height 12/06/20 1257 5\' 4"  (1.626 m)     Head Circumference --      Peak Flow --      Pain Score 12/06/20 1257 5     Pain Loc --      Pain Edu? --      Excl. in GC? --    No data found.  Updated Vital Signs BP 119/75 (BP Location: Left Arm)   Pulse (!) 113   Temp (!) 102.3 F (39.1 C) (Oral) Comment: Patient took Tylenol a hour ago  Resp 16   Ht 5\' 4"  (1.626 m)   Wt 140 lb (63.5 kg)   SpO2 97%   BMI 24.03 kg/m   Physical Exam Vitals and nursing note reviewed.  Constitutional:      General: He is not in acute distress.    Appearance: Normal appearance. He is well-developed. He is ill-appearing.  HENT:     Head: Normocephalic and atraumatic.     Nose: Congestion and rhinorrhea present.     Mouth/Throat:     Mouth: Mucous membranes are moist.     Pharynx: Oropharynx is clear. Posterior oropharyngeal erythema present.  Eyes:     General: No scleral icterus.    Conjunctiva/sclera: Conjunctivae normal.  Cardiovascular:     Rate and Rhythm: Regular rhythm. Tachycardia present.     Heart sounds: Normal heart sounds.  Pulmonary:     Effort: Pulmonary effort is normal. No respiratory distress.     Breath sounds: Normal breath sounds.  Musculoskeletal:        General: No swelling.     Cervical back: Neck supple.  Skin:    General: Skin is warm and dry.     Capillary Refill: Capillary refill takes less than 2 seconds.  Neurological:     General: No focal deficit present.     Mental Status: He is alert. Mental status is at baseline.     Motor: No weakness.     Coordination: Coordination normal.     Gait: Gait normal.  Psychiatric:        Mood and Affect: Mood normal.        Behavior: Behavior normal.        Thought Content: Thought content normal.     UC Treatments / Results  Labs (all labs ordered are listed, but only abnormal results are  displayed) Labs Reviewed  RESP PANEL BY RT-PCR (FLU A&B, COVID) ARPGX2 - Abnormal; Notable  for the following components:      Result Value   SARS Coronavirus 2 by RT PCR POSITIVE (*)    Influenza A by PCR POSITIVE (*)    All other components within normal limits    EKG   Radiology No results found.  Procedures Procedures (including critical care time)  Medications Ordered in UC Medications  ibuprofen (ADVIL) tablet 600 mg (600 mg Oral Given 12/06/20 1304)    Initial Impression / Assessment and Plan / UC Course  I have reviewed the triage vital signs and the nursing notes.  Pertinent labs & imaging results that were available during my care of the patient were reviewed by me and considered in my medical decision making (see chart for details).   25 year old male presenting for fever since earlier today.  Also reports cough and body aches that started yesterday.  Patient has history of COVID-19 about 2 weeks ago.  Temp is currently 102.3 degrees in clinic.  Patient given Advil.  Pulse elevated at 113 bpm.  He is ill-appearing but not toxic.  Oxygen is 97% to 98%.  No respiratory distress or obvious trouble breathing.  He does have nasal congestion and clear drainage on exam as well as mild posterior pharyngeal erythema.  Chest clear to auscultation.  We do not have any rapid flu test so we had to perform a respiratory panel which also checks in for COVID-19.  Expect that he will have persistent positive COVID test which he does have.  Patient also positive for influenza A.  Discussed results with him.  Printed prescription for Tamiflu and Bromfed-DM.  Supportive care with continue Motrin and Tylenol as needed for fever.  Rest and fluids.  Work note given.  Thoroughly reviewed return and ED precautions   Final Clinical Impressions(s) / UC Diagnoses   Final diagnoses:  Influenza A  Acute cough  Fever, unspecified  History of COVID-19     Discharge Instructions      -You  are positive for influenza A.  This is a cause for your symptoms your COVID test is also positive but as we discussed that can remain positive for a couple of months.  It does not mean that you still have COVID or that you are contagious. -I printed a prescription for Tamiflu and also for cough.  Contact the pharmacies to see if they carry it before you go.  Some of them have been running low. - Increase rest and fluids. - Need to stay out of work until you are fever free greater than 24 hours and feeling better. - Use the home nebulizer if you feel short of breath again but your oxygen is normal today and your chest is clear. - Go to emergency department for any severe acute worsening of your symptoms, especially any uncontrollable fever, weakness or trouble breathing.     ED Prescriptions     Medication Sig Dispense Auth. Provider   oseltamivir (TAMIFLU) 75 MG capsule Take 1 capsule (75 mg total) by mouth every 12 (twelve) hours for 5 days. 10 capsule Laurene Footman B, PA-C   brompheniramine-pseudoephedrine-DM 30-2-10 MG/5ML syrup Take 10 mLs by mouth 4 (four) times daily as needed for up to 7 days. 150 mL Danton Clap, PA-C      PDMP not reviewed this encounter.   Danton Clap, PA-C 12/06/20 1533

## 2020-12-06 NOTE — ED Triage Notes (Signed)
Patient states that he had covid 2-3 weeks ago.  Patient states that he was exposed to a coworker who tested positive for the flu.  Patient reports cough and bodyaches that started 2 days ago.  Patient reports some SOB.

## 2022-09-23 NOTE — Progress Notes (Signed)
 CHIEF COMPLAINT:   Chief Complaint  Patient presents with  . Chest Pain    Started yesterday at approx 1430. Substernal. Lasted approx 5-6 hours, had EKG at work. Notes still experiencing some pain, less than when experienced before. Describes as pressure and intermittent sharp pain between shoulder blades. Mild dizziness and fatigue noted.  Took tums and gas-x, no relief.Family hx of heart attack.     SUBJECTIVE/HPI:  27 y.o.Male presents with chest pressure x approx 22 hrs. After playing pickleball at work, while relaxing, pt noticed onset of chest pressure to anterior chest. This was prominent for 5-6 hrs and then eased off, though is still a little bit present now. Reports intermittent pain between scapulae that is not reproducible with movement or palpation, onset same time as chest pressure. Also endorses intermittent nausea yesterday since AM. Tried tums and gas x with no immediate change; sx took hours to decrease. Endorses mild dizziness and fatigue.  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  No exertional sx acutely or recently; is active/works active job without difficulty. Father dx'd with angina recently, age 60. Past history of vaping, not current. No h/o DM or HTN. No recent travel/immobilization. No recent URI. Denies Fever/chills, diaphoresis, SOB, dyspnea, LE swelling, acid reflux, vomiting, numbness/tingling.  Medications, Allergies and Problem List personally reviewed in Epic today.   Past Medical History:  has a past medical history of Fracture of lumbar vertebra (CMS/HHS-HCC). Prior to encounter Medications:  Current Outpatient Medications on File Prior to Visit  Medication Sig Dispense Refill  . acetaminophen (TYLENOL) 325 MG tablet Take 650 mg by mouth every 4 (four) hours as needed for Pain    . meloxicam (MOBIC) 15 MG tablet Take 15 mg by mouth as needed     No current facility-administered medications on file prior to visit.    Allergies: has No Known Allergies.  ROS:  Review of Systems As noted in HPI  OBJECTIVE:   Vitals:   09/23/22 1140  BP: 118/69  Pulse: 56  Resp: 16  Temp: 37.1 C (98.7 F)  TempSrc: Oral  SpO2: 98%  Weight: 72.1 kg (158 lb 15.2 oz)  PainSc:   6  PainLoc: Chest   Physical Exam Constitutional:      Appearance: Normal appearance.  Cardiovascular:     Rate and Rhythm: Normal rate and regular rhythm.  Pulmonary:     Effort: Pulmonary effort is normal.     Breath sounds: Normal breath sounds.  Musculoskeletal:     Comments: No back TTP. No back pain elicited with back/arm movements.   Skin:    General: Skin is warm and dry.  Neurological:     General: No focal deficit present.     Mental Status: He is alert and oriented to person, place, and time.  Psychiatric:        Mood and Affect: Mood normal.        Behavior: Behavior normal.    Reviewed previous notes and labs in chart review LABS/X-RAYS/EKG/MEDS:   Results for orders placed or performed in visit on 09/23/22  ECG 12-lead  Result Value Ref Range   Vent Rate (bpm) 55    PR Interval (msec) 128    QRS Interval (msec) 96    QT Interval (msec) 394    QTc (msec) 376     EKG - NSR with no acute ischemic changes - my read, pending final.   ASSESSMENT/PLAN:  27 y.o.Male presents  with chest pressure x approx 22 hrs. VSS, PE above. A reassuring work-up here includes an EKG with no acute ST changes, negative troponin, and unremarkable CXR. Discussed limitations of UC workup however at this time do not feel the pt's symptoms are consistent with an emergent cardiopulmonary process needing ED transfer at this time. Discussed supportive care - rest, fluids. PCP FU if persistent. Gave ED precautions.   I considered the following conditions but do not suspect:  Acute coronary syndrome: EKG shows no signs of any acute ischemia, also based on history physical exam and lack of risk factors  Aortic dissection: Denies any  ripping or tearing like pain.  Has no associated neurologic symptoms.  No evidence of widened mediastinum on chest x-ray.   Pulmonary embolism: Patient is not tachycardic or tachypneic.   Denies any pleuritic-like pain.  No history of DVT or PE.   Also considered but do not suspect myocarditis, pericarditis, endocarditis based on hx and exam. Chest x-ray is negative for pneumothorax, hemothorax, effusions, or infiltrates. Also considered but do not suspect upper abdominal pathology such as acute cholecystitis, pancreatitis.    ICD-10-CM   1. Chest pain at rest  R07.9 ECG 12-lead    [facility] POC Troponin I    X-ray chest PA and lateral     Requested Prescriptions    No prescriptions requested or ordered in this encounter   This note was partially made with the aid of speech-to-text dictation; typographical errors are not intentional.

## 2023-07-14 ENCOUNTER — Other Ambulatory Visit: Payer: Self-pay

## 2023-07-14 ENCOUNTER — Emergency Department (HOSPITAL_COMMUNITY)
Admission: EM | Admit: 2023-07-14 | Discharge: 2023-07-14 | Disposition: A | Discharge status: Home / Self Care | Payer: Worker's Compensation | Attending: Student | Admitting: Student

## 2023-07-14 DIAGNOSIS — Z7721 Contact with and (suspected) exposure to potentially hazardous body fluids: Secondary | ICD-10-CM | POA: Insufficient documentation

## 2023-07-14 LAB — HEPATITIS PANEL, ACUTE
HCV Ab: NONREACTIVE
Hep A IgM: NONREACTIVE
Hep B C IgM: NONREACTIVE
Hepatitis B Surface Ag: NONREACTIVE

## 2023-07-14 LAB — RAPID HIV SCREEN (HIV 1/2 AB+AG)
HIV 1/2 Antibodies: NONREACTIVE
HIV-1 P24 Antigen - HIV24: NONREACTIVE

## 2023-07-14 NOTE — ED Provider Notes (Signed)
 [facility] Emergency Department Provider Note MRN:  [id number]  Arrival date & time: 07/14/23     Chief Complaint   Body Fluid Exposure   History of Present Illness   Wayne Cortez is a 28 y.o. year-old male presents to the ED with chief complaint of exposure to body fluid.  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  Patient hx unknown.  Patient was flown to [facility].  Patient denies hx of HIV or hepatitis.  Denies any other complaints.  History provided by patient.   Review of Systems  Pertinent positive and negative review of systems noted in HPI.    Physical Exam   Vitals:   07/14/23 0159 07/14/23 0200  BP:  125/78  Pulse:  86  Resp:  16  Temp: 98.1 F (36.7 C)   SpO2:  100%    CONSTITUTIONAL:  non toxic-appearing, NAD NEURO:  Alert and oriented x 3, CN 3-12 grossly intact EYES:  eyes equal and reactive ENT/NECK:  Supple, no stridor  CARDIO:  normal rate, regular rhythm, appears well-perfused  PULM:  No respiratory distress, CTAB GI/GU:  non-distended,  MSK/SPINE:  No gross deformities, no edema, moves all extremities  SKIN:  no rash, atraumatic   *Additional and/or pertinent findings included in MDM below  Diagnostic and Interventional Summary    EKG Interpretation Date/Time:    Ventricular Rate:    PR Interval:    QRS Duration:    QT Interval:    QTC Calculation:   R Axis:      Text Interpretation:         Labs Reviewed  RAPID HIV SCREEN (HIV 1/2 AB+AG)  HEPATITIS PANEL, ACUTE    No orders to display    Medications - No data to display   Procedures  /  Critical Care Procedures  ED Course and Medical Decision Making  I have reviewed the triage vital signs, the nursing notes, and pertinent available records from the EMR.  Social Determinants Affecting Complexity of Care: Patient has no clinically significant social determinants affecting this chief  complaint..   ED Course:    Medical Decision Making Patient here after exposure to blood.  He got some patient blood had an accident in his mouth.  The source patient was flown to [facility].  Patient's shift commander is getting in touch with [facility] to have exposure panel drawn.  We discussed low risk of transmission for blood-borne pathogen's via oral mucosa, especially with small volume.  Discussed risks and benefits of postexposure prophylaxis.  Shared decision making used.  We will notify patient if any of his labs come back positive, and we will have him follow-up with his shift commander to find out about the source patient labs.  Will hold off on PEP at this time.  Amount and/or Complexity of Data Reviewed Labs: ordered.         Consultants: No consultations were needed in caring for this patient.   Treatment and Plan: Emergency department workup does not suggest an emergent condition requiring admission or immediate intervention beyond  what has been performed at this time. The patient is safe for discharge and has  been instructed to return immediately for worsening symptoms, change in  symptoms or any other concerns  Patient discussed with attending physician, Dr. Albertina, who agrees that PEP likely not needed due to mucosal exposure of small volume blood.  SABRA  Final Clinical Impressions(s) / ED Diagnoses     ICD-10-CM   1. Exposure to blood  Z77.21       ED Discharge Orders     None         Discharge Instructions Discussed with and Provided to Patient:     Discharge Instructions      Please see if you are able to get access to the source patient's labs.  If not, you will likely have to have repeat lab testing as directed by her Worker's Comp. doctor.  Your rapid HIV was negative.  We will call you if your hepatitis is positive.  No news is good news.       Vicky Charleston, PA-C 07/14/23 0302    Albertina Dixon, MD 07/14/23 2352

## 2023-07-14 NOTE — Discharge Instructions (Signed)
 Please see if you are able to get access to the source patient's labs.  If not, you will likely have to have repeat lab testing as directed by her Worker's Comp. doctor.  Your rapid HIV was negative.  We will call you if your hepatitis is positive.  No news is good news.

## 2023-07-14 NOTE — ED Triage Notes (Signed)
 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 A&Ox4.

## 2023-10-20 ENCOUNTER — Other Ambulatory Visit: Payer: Self-pay

## 2023-10-20 ENCOUNTER — Emergency Department (HOSPITAL_BASED_OUTPATIENT_CLINIC_OR_DEPARTMENT_OTHER): Admitting: Radiology

## 2023-10-20 ENCOUNTER — Emergency Department (HOSPITAL_BASED_OUTPATIENT_CLINIC_OR_DEPARTMENT_OTHER)
Admission: EM | Admit: 2023-10-20 | Discharge: 2023-10-20 | Disposition: A | Discharge status: Home / Self Care | Source: Ambulatory Visit | Attending: Emergency Medicine | Admitting: Emergency Medicine

## 2023-10-20 ENCOUNTER — Encounter (HOSPITAL_BASED_OUTPATIENT_CLINIC_OR_DEPARTMENT_OTHER): Payer: Self-pay

## 2023-10-20 DIAGNOSIS — M25572 Pain in left ankle and joints of left foot: Secondary | ICD-10-CM | POA: Diagnosis not present

## 2023-10-20 DIAGNOSIS — Y990 Civilian activity done for income or pay: Secondary | ICD-10-CM | POA: Diagnosis not present

## 2023-10-20 DIAGNOSIS — W228XXA Striking against or struck by other objects, initial encounter: Secondary | ICD-10-CM | POA: Insufficient documentation

## 2023-10-20 DIAGNOSIS — M79662 Pain in left lower leg: Secondary | ICD-10-CM | POA: Insufficient documentation

## 2023-10-20 MED ORDER — IBUPROFEN 800 MG PO TABS
800.0000 mg | ORAL_TABLET | Freq: Once | ORAL | Status: AC
  Administered 2023-10-20: 800 mg via ORAL
  Filled 2023-10-20: qty 1

## 2023-10-20 NOTE — ED Triage Notes (Signed)
 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 He reports it hurts worse by his shin. He is in a splint, pulses palpable, extremity warm.

## 2023-10-20 NOTE — Discharge Instructions (Addendum)
 No obvious broken bones were noted today on your x-rays.  Please use cam walker and keep in place until pain-free or followed up by orthopedic surgery. Please use cold therapy, elevation, and compression.

## 2023-10-20 NOTE — ED Provider Notes (Signed)
 [place] EMERGENCY DEPARTMENT AT [facility] Provider Note   CSN: [id number] Arrival date & time: 10/20/23  1335     Patient presents with: Leg Injury   Wayne Cortez is a 28 y.o. male.   HPI 28 year old male presents today complaining of left leg and ankle pain.  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  He rocked back and had sharp pain in his left leg.  Has been nonambulatory since that time.  He was taken to their company doctor and then sent to the ED.  Has not been ambulatory on his leg.  Denies other injuries.    Prior to Admission medications   Medication Sig Start Date End Date Taking? Authorizing Provider  acetaminophen (TYLENOL) 325 MG tablet Take 650 mg by mouth every 6 (six) hours as needed.    [provider]  cyclobenzaprine (FLEXERIL) 5 MG tablet Take 1 tablet by mouth 3 (three) times daily as needed.    [provider]  meloxicam (MOBIC) 15 MG tablet Take 1 tablet by mouth daily.    [provider]  ondansetron  (ZOFRAN ) 4 MG tablet Take 1 tablet (4 mg total) every 8 (eight) hours as needed by mouth for nausea or vomiting. 11/15/16   Stoioff, Glendia BROCKS, MD  phenazopyridine  (PYRIDIUM ) 200 MG tablet Take 1 tablet (200 mg total) 3 (three) times daily as needed by mouth for pain (burning with urination). 11/15/16   Stoioff, Glendia BROCKS, MD    Allergies: Patient has no known allergies.    Review of Systems  Updated Vital Signs BP 128/65   Pulse 70   Temp 98.9 F (37.2 C)   Resp 16   SpO2 99%   Physical Exam Vitals and nursing note reviewed.  Constitutional:      Appearance: He is well-developed.  HENT:     Head: Normocephalic and atraumatic.     Right Ear: External ear normal.     Left Ear: External ear normal.     Nose: Nose normal.  Eyes:     Extraocular Movements: Extraocular movements intact.  Neck:     Trachea: No tracheal deviation.  Cardiovascular:     Rate and Rhythm: Normal  rate.  Pulmonary:     Effort: Pulmonary effort is normal.  Musculoskeletal:        General: Normal range of motion.     Comments: Left leg visually inspected with some swelling noted to lateral aspect of left ankle. On palpation there is no step-off or crepitus There is some tenderness in the mid tibia where he describes the most amount of pain. There is no point tenderness noted over the ankle. Toes are pink Pulses are intact Patient has sensation and motor intact  Skin:    General: Skin is warm and dry.  Neurological:     Mental Status: He is alert and oriented to person, place, and time.  Psychiatric:        Mood and Affect: Mood normal.        Behavior: Behavior normal.     (all labs ordered are listed, but only abnormal results are displayed) Labs Reviewed - No data to display  EKG: None  Radiology: DG Tibia/Fibula Left Result Date: 10/20/2023 CLINICAL DATA:  Status post trauma. EXAM: LEFT TIBIA AND FIBULA - 2 VIEW COMPARISON:  None Available. FINDINGS: There is no evidence of fracture or other focal bone lesions. The inferior border of the left  calf is poorly defined on the lateral view. IMPRESSION: No acute osseous abnormality. Electronically Signed   By: Suzen Dials M.D.   On: 10/20/2023 14:18     Procedures   Medications Ordered in the ED  ibuprofen  (ADVIL ) tablet 800 mg (has no administration in time range)                                    Medical Decision Making Amount and/or Complexity of Data Reviewed Radiology: ordered.  Risk Prescription drug management.   Patient with injury to left lower leg.  Patient with tenderness in mid tibia. No obvious fracture seen on x-Keirsten Matuska. No point tenderness noted over ankle although some swelling noted. X-rays obtained that showed no evidence of acute fracture. Plan cam walker and follow-up with Ortho.  Patient will elevate and use ibuprofen  for pain.  We discussed other conservative therapies such as  ice. Patient is followed at [facility] and will follow-up with them.     Final diagnoses:  Pain in left lower leg    ED Discharge Orders     None          Levander Houston, MD 10/20/23 1440
